# Patient Record
Sex: Female | Born: 1975
Health system: Southern US, Community
[De-identification: ages and names within clinical notes are randomized; demographics above are authoritative.]

## PROBLEM LIST (undated history)

## (undated) ENCOUNTER — Emergency Department (HOSPITAL_COMMUNITY): Admission: EM | Payer: Self-pay | Source: Home / Self Care

## (undated) DIAGNOSIS — Q249 Congenital malformation of heart, unspecified: Secondary | ICD-10-CM

## (undated) DIAGNOSIS — B019 Varicella without complication: Secondary | ICD-10-CM

## (undated) DIAGNOSIS — T7840XA Allergy, unspecified, initial encounter: Secondary | ICD-10-CM

## (undated) DIAGNOSIS — I519 Heart disease, unspecified: Secondary | ICD-10-CM

## (undated) DIAGNOSIS — K219 Gastro-esophageal reflux disease without esophagitis: Secondary | ICD-10-CM

## (undated) HISTORY — DX: Heart disease, unspecified: I51.9

## (undated) HISTORY — DX: Allergy, unspecified, initial encounter: T78.40XA

## (undated) HISTORY — DX: Varicella without complication: B01.9

## (undated) HISTORY — DX: Congenital malformation of heart, unspecified: Q24.9

## (undated) HISTORY — DX: Gastro-esophageal reflux disease without esophagitis: K21.9

---

## 1995-05-13 HISTORY — PX: CARDIAC DEFIBRILLATOR PLACEMENT: SHX171

## 2007-04-01 ENCOUNTER — Encounter: Admission: RE | Admit: 2007-04-01 | Discharge: 2007-04-01 | Payer: Self-pay | Admitting: Obstetrics

## 2010-10-09 ENCOUNTER — Emergency Department (HOSPITAL_COMMUNITY)
Admission: EM | Admit: 2010-10-09 | Discharge: 2010-10-09 | Disposition: A | Discharge status: Home / Self Care | Payer: PRIVATE HEALTH INSURANCE | Attending: Emergency Medicine | Admitting: Emergency Medicine

## 2010-10-09 DIAGNOSIS — K219 Gastro-esophageal reflux disease without esophagitis: Secondary | ICD-10-CM | POA: Insufficient documentation

## 2010-10-09 DIAGNOSIS — Z95810 Presence of automatic (implantable) cardiac defibrillator: Secondary | ICD-10-CM | POA: Insufficient documentation

## 2010-10-09 DIAGNOSIS — K3189 Other diseases of stomach and duodenum: Secondary | ICD-10-CM | POA: Insufficient documentation

## 2010-10-09 DIAGNOSIS — I499 Cardiac arrhythmia, unspecified: Secondary | ICD-10-CM | POA: Insufficient documentation

## 2010-10-09 LAB — CBC
HCT: 38.2 % (ref 36.0–46.0)
MCH: 31.5 pg (ref 26.0–34.0)
Platelets: 209 10*3/uL (ref 150–400)
RDW: 11.9 % (ref 11.5–15.5)
WBC: 8.6 10*3/uL (ref 4.0–10.5)

## 2010-10-09 LAB — COMPREHENSIVE METABOLIC PANEL
ALT: 11 U/L (ref 0–35)
Albumin: 3.7 g/dL (ref 3.5–5.2)
Alkaline Phosphatase: 71 U/L (ref 39–117)
CO2: 29 mEq/L (ref 19–32)
Creatinine, Ser: 0.79 mg/dL (ref 0.4–1.2)
Potassium: 4 mEq/L (ref 3.5–5.1)
Sodium: 139 mEq/L (ref 135–145)

## 2010-10-09 LAB — DIFFERENTIAL
Eosinophils Absolute: 0.1 10*3/uL (ref 0.0–0.7)
Eosinophils Relative: 1 % (ref 0–5)
Lymphocytes Relative: 20 % (ref 12–46)
Lymphs Abs: 1.7 10*3/uL (ref 0.7–4.0)
Monocytes Absolute: 0.5 10*3/uL (ref 0.1–1.0)
Monocytes Relative: 6 % (ref 3–12)

## 2010-10-09 LAB — LIPASE, BLOOD: Lipase: 19 U/L (ref 11–59)

## 2010-11-20 ENCOUNTER — Other Ambulatory Visit (HOSPITAL_COMMUNITY): Payer: Self-pay | Admitting: Gastroenterology

## 2010-11-20 DIAGNOSIS — R110 Nausea: Secondary | ICD-10-CM

## 2010-12-05 ENCOUNTER — Encounter (HOSPITAL_COMMUNITY)
Admission: RE | Admit: 2010-12-05 | Discharge: 2010-12-05 | Disposition: A | Discharge status: Home / Self Care | Payer: PRIVATE HEALTH INSURANCE | Source: Ambulatory Visit | Attending: Gastroenterology | Admitting: Gastroenterology

## 2010-12-05 DIAGNOSIS — R110 Nausea: Secondary | ICD-10-CM | POA: Insufficient documentation

## 2010-12-05 MED ORDER — TECHNETIUM TC 99M SULFUR COLLOID
2.0000 | Freq: Once | INTRAVENOUS | Status: AC | PRN
  Administered 2010-12-05: 2 via ORAL

## 2011-02-07 DIAGNOSIS — I428 Other cardiomyopathies: Secondary | ICD-10-CM | POA: Insufficient documentation

## 2011-03-25 DIAGNOSIS — Z95810 Presence of automatic (implantable) cardiac defibrillator: Secondary | ICD-10-CM | POA: Insufficient documentation

## 2011-12-05 ENCOUNTER — Ambulatory Visit (INDEPENDENT_AMBULATORY_CARE_PROVIDER_SITE_OTHER): Payer: PRIVATE HEALTH INSURANCE | Admitting: Family Medicine

## 2011-12-05 ENCOUNTER — Encounter: Payer: Self-pay | Admitting: Family Medicine

## 2011-12-05 VITALS — BP 108/70 | HR 81 | Temp 98.3°F | Ht 65.75 in | Wt 131.6 lb

## 2011-12-05 DIAGNOSIS — K3184 Gastroparesis: Secondary | ICD-10-CM | POA: Insufficient documentation

## 2011-12-05 DIAGNOSIS — H02719 Chloasma of unspecified eye, unspecified eyelid and periocular area: Secondary | ICD-10-CM

## 2011-12-05 DIAGNOSIS — Q249 Congenital malformation of heart, unspecified: Secondary | ICD-10-CM | POA: Insufficient documentation

## 2011-12-05 NOTE — Patient Instructions (Addendum)
Schedule your complete physical yearly at your convenience Think of [place] as your home base! Call with any questions or concerns Welcome!  We're glad to have you!!
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
## 2011-12-16 DIAGNOSIS — H02719 Chloasma of unspecified eye, unspecified eyelid and periocular area: Secondary | ICD-10-CM | POA: Insufficient documentation

## 2011-12-16 NOTE — Assessment & Plan Note (Signed)
New to provider.  Following w/ GI.  No known cause.  Previously on Reglan but now only taking this PRN.  Pt's sxs seem to have component of IBS.  Will follow along and assist as able.
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
## 2011-12-16 NOTE — Assessment & Plan Note (Signed)
New to provider.  Following regularly w/ [facility].  Has ICD in place.  Currently asymptomatic.  Will follow.

## 2012-07-14 ENCOUNTER — Ambulatory Visit (INDEPENDENT_AMBULATORY_CARE_PROVIDER_SITE_OTHER): Payer: PRIVATE HEALTH INSURANCE | Admitting: Family Medicine

## 2012-07-14 ENCOUNTER — Encounter: Payer: Self-pay | Admitting: Family Medicine

## 2012-07-14 VITALS — BP 94/60 | HR 67 | Temp 98.3°F | Ht 65.75 in | Wt 137.2 lb

## 2012-07-14 DIAGNOSIS — R42 Dizziness and giddiness: Secondary | ICD-10-CM

## 2012-07-14 DIAGNOSIS — J309 Allergic rhinitis, unspecified: Secondary | ICD-10-CM

## 2012-07-14 LAB — CBC WITH DIFFERENTIAL/PLATELET
Basophils Relative: 1.1 % (ref 0.0–3.0)
HCT: 36.8 % (ref 36.0–46.0)
Hemoglobin: 12.5 g/dL (ref 12.0–15.0)
Lymphs Abs: 1 10*3/uL (ref 0.7–4.0)
MCHC: 33.9 g/dL (ref 30.0–36.0)
Monocytes Absolute: 0.4 10*3/uL (ref 0.1–1.0)
Neutro Abs: 2.7 10*3/uL (ref 1.4–7.7)
Neutrophils Relative %: 62.3 % (ref 43.0–77.0)
Platelets: 202 10*3/uL (ref 150.0–400.0)
RBC: 3.97 Mil/uL (ref 3.87–5.11)
RDW: 12.7 % (ref 11.5–14.6)
WBC: 4.4 10*3/uL — ABNORMAL LOW (ref 4.5–10.5)

## 2012-07-14 LAB — BASIC METABOLIC PANEL
BUN: 13 mg/dL (ref 6–23)
Potassium: 3.8 mEq/L (ref 3.5–5.1)

## 2012-07-14 MED ORDER — FLUTICASONE PROPIONATE 50 MCG/ACT NA SUSP: 2.0000 | Freq: Every day | NASAL | Status: DC

## 2012-07-14 NOTE — Progress Notes (Signed)
  Subjective:    Patient ID: Lindsay Hanson, female    DOB: Dec 20, 1975, 37 y.o.   MRN: [id number]  HPI Dizzy spells- sxs started ~1 month ago.  Denies vertigo.  More of an 'off balance' feeling.  Worse w/ bending over and standing quickly.  Also occurs w/ walking.  No dizziness w/ turning of head.  Following w/ Dr Alden Hipp at [facility] for ARVD.  Nasal congestion- sxs started yesterday, no fevers.  + facial pressure, ear pressure/pain.  No cough.  + sneezing.  + seasonal allergies.  Not currently on meds.   Review of Systems For ROS see HPI     Objective:   Physical Exam  Vitals reviewed. Constitutional: She is oriented to person, place, and time. She appears well-developed and well-nourished. No distress.  HENT:  Head: Normocephalic and atraumatic.  Right Ear: Tympanic membrane normal.  Left Ear: Tympanic membrane normal.  Nose: Mucosal edema and rhinorrhea present. Right sinus exhibits no maxillary sinus tenderness and no frontal sinus tenderness. Left sinus exhibits no maxillary sinus tenderness and no frontal sinus tenderness.  Mouth/Throat: Mucous membranes are normal. Posterior oropharyngeal erythema (w/ PND) present.  Eyes: Conjunctivae and EOM are normal. Pupils are equal, round, and reactive to light.  Neck: Normal range of motion. Neck supple.  Cardiovascular: Normal rate, regular rhythm and normal heart sounds.   Pulmonary/Chest: Effort normal and breath sounds normal. No respiratory distress. She has no wheezes. She has no rales.  Lymphadenopathy:    She has no cervical adenopathy.  Neurological: She is alert and oriented to person, place, and time. She has normal reflexes. No cranial nerve deficit. Coordination normal.  Skin: Skin is warm and dry.          Assessment & Plan:

## 2012-07-14 NOTE — Patient Instructions (Addendum)
Your nasal congestion is due to seasonal allergies Start the Flonase daily- 2 sprays each nostril Start OTC Claritin or Zyrtec daily Your dizziness appears to be due to orthostasis- drop in BP that can't respond due to the meds Change positions slowly Increase your fluid intake We'll notify you of your lab results and make any changes if needed Hang in there!

## 2012-07-15 ENCOUNTER — Encounter: Payer: Self-pay | Admitting: *Deleted

## 2012-07-17 NOTE — Assessment & Plan Note (Addendum)
New.  Pt's sxs and PE consistent w/ untreated allergies.  Start nasal steroid, OTC antihistamine.  Will follow.

## 2012-07-17 NOTE — Assessment & Plan Note (Signed)
New.  Suspect that pt is orthostatic due to beta blocker use.  Will check labs to r/o anemia, electrolyte abnormality, thyroid problem.  Encouraged increased fluids.  Will forward note to [facility] for possible medication adjustment.  Pt expressed understanding and is in agreement w/ plan.

## 2012-10-21 ENCOUNTER — Other Ambulatory Visit (INDEPENDENT_AMBULATORY_CARE_PROVIDER_SITE_OTHER): Payer: PRIVATE HEALTH INSURANCE

## 2012-10-21 DIAGNOSIS — Z79899 Other long term (current) drug therapy: Secondary | ICD-10-CM

## 2012-10-21 LAB — MAGNESIUM: Magnesium: 1.7 mg/dL (ref 1.5–2.5)

## 2012-10-21 LAB — BRAIN NATRIURETIC PEPTIDE: Pro B Natriuretic peptide (BNP): 164 pg/mL — ABNORMAL HIGH (ref 0.0–100.0)

## 2013-04-11 LAB — HM PAP SMEAR: HM Pap smear: NORMAL

## 2013-06-07 ENCOUNTER — Encounter (HOSPITAL_COMMUNITY): Payer: PRIVATE HEALTH INSURANCE

## 2013-06-10 ENCOUNTER — Ambulatory Visit (HOSPITAL_COMMUNITY)
Admission: RE | Admit: 2013-06-10 | Discharge: 2013-06-10 | Disposition: A | Discharge status: Home / Self Care | Payer: PRIVATE HEALTH INSURANCE | Source: Ambulatory Visit | Attending: Obstetrics | Admitting: Obstetrics

## 2013-07-19 ENCOUNTER — Encounter: Payer: Self-pay | Admitting: General Practice

## 2013-10-25 ENCOUNTER — Encounter: Payer: PRIVATE HEALTH INSURANCE | Admitting: Family Medicine

## 2013-10-25 ENCOUNTER — Telehealth: Payer: Self-pay

## 2013-10-25 NOTE — Telephone Encounter (Signed)
Left message for call back Non identifiable  

## 2013-10-26 ENCOUNTER — Ambulatory Visit (INDEPENDENT_AMBULATORY_CARE_PROVIDER_SITE_OTHER): Payer: PRIVATE HEALTH INSURANCE | Admitting: Family Medicine

## 2013-10-26 ENCOUNTER — Encounter: Payer: Self-pay | Admitting: Family Medicine

## 2013-10-26 VITALS — BP 112/72 | HR 74 | Temp 98.6°F | Resp 16 | Ht 66.5 in | Wt 134.2 lb

## 2013-10-26 DIAGNOSIS — Z0000 Encounter for general adult medical examination without abnormal findings: Secondary | ICD-10-CM

## 2013-10-26 DIAGNOSIS — Q249 Congenital malformation of heart, unspecified: Secondary | ICD-10-CM

## 2013-10-26 NOTE — Progress Notes (Signed)
Pre visit review using our clinic review tool, if applicable. No additional management support is needed unless otherwise documented below in the visit note. 

## 2013-10-26 NOTE — Assessment & Plan Note (Signed)
Pt's PE WNL.  UTD on GYN.  Check labs.  EKG unchanged from previous.  Will forward to [facility] (Dr Alden Hipp)

## 2013-10-26 NOTE — Patient Instructions (Signed)
Follow up in 1 year for your physical- sooner if needed We'll notify you of your lab results and make any changes if needed Keep up the good work!  You look great! Call with any questions or concerns Hang in there!  Hopefully the tough stuff is behind you!!!

## 2013-10-26 NOTE — Progress Notes (Signed)
   Subjective:    Patient ID: Lindsay Hanson, female    DOB: 1976-03-10, 38 y.o.   MRN: [id number]  HPI CPE- UTD on GYN ([facility]).  No concerns today.   Review of Systems Patient reports no vision/ hearing changes, adenopathy,fever, weight change,  persistant/recurrent hoarseness , swallowing issues, chest pain, palpitations, edema, persistant/recurrent cough, hemoptysis, dyspnea (rest/exertional/paroxysmal nocturnal), gastrointestinal bleeding (melena, rectal bleeding), abdominal pain, significant heartburn, bowel changes, GU symptoms (dysuria, hematuria, incontinence), Gyn symptoms (abnormal  bleeding, pain),  syncope, focal weakness, memory loss, numbness & tingling, skin/hair/nail changes, abnormal bruising or bleeding, anxiety, or depression.     Objective:   Physical Exam General Appearance:    Alert, cooperative, no distress, appears stated age  Head:    Normocephalic, without obvious abnormality, atraumatic  Eyes:    PERRL, conjunctiva/corneas clear, EOM's intact, fundi    benign, both eyes  Ears:    Normal TM's and external ear canals, both ears  Nose:   Nares normal, septum midline, mucosa normal, no drainage    or sinus tenderness  Throat:   Lips, mucosa, and tongue normal; teeth and gums normal  Neck:   Supple, symmetrical, trachea midline, no adenopathy;    Thyroid: no enlargement/tenderness/nodules  Back:     Symmetric, no curvature, ROM normal, no CVA tenderness  Lungs:     Clear to auscultation bilaterally, respirations unlabored  Chest Wall:    No tenderness or deformity   Heart:    Regular rate and rhythm, S1 and S2 normal, no murmur, rub   or gallop  Breast Exam:    Deferred to GYN  Abdomen:     Soft, non-tender, bowel sounds active all four quadrants,    no masses, no organomegaly  Genitalia:    Deferred to GYN  Rectal:    Extremities:   Extremities normal, atraumatic, no cyanosis or edema  Pulses:   2+ and symmetric all extremities  Skin:   Skin color,  texture, turgor normal, no rashes or lesions  Lymph nodes:   Cervical, supraclavicular, and axillary nodes normal  Neurologic:   CNII-XII intact, normal strength, sensation and reflexes    throughout          Assessment & Plan:

## 2013-10-27 LAB — VITAMIN D 25 HYDROXY (VIT D DEFICIENCY, FRACTURES): VITD: 30.45 ng/mL

## 2013-10-27 LAB — CBC WITH DIFFERENTIAL/PLATELET
BASOS PCT: 0.3 % (ref 0.0–3.0)
Basophils Absolute: 0 10*3/uL (ref 0.0–0.1)
Eosinophils Absolute: 0.2 10*3/uL (ref 0.0–0.7)
Eosinophils Relative: 3.7 % (ref 0.0–5.0)
HCT: 37.5 % (ref 36.0–46.0)
HEMOGLOBIN: 12.5 g/dL (ref 12.0–15.0)
LYMPHS ABS: 1.7 10*3/uL (ref 0.7–4.0)
Lymphocytes Relative: 30.4 % (ref 12.0–46.0)
MCHC: 33.4 g/dL (ref 30.0–36.0)
MCV: 95.1 fl (ref 78.0–100.0)
MONO ABS: 0.2 10*3/uL (ref 0.1–1.0)
Monocytes Relative: 4.1 % (ref 3.0–12.0)
Neutro Abs: 3.5 10*3/uL (ref 1.4–7.7)
Neutrophils Relative %: 61.5 % (ref 43.0–77.0)
Platelets: 208 10*3/uL (ref 150.0–400.0)
RBC: 3.94 Mil/uL (ref 3.87–5.11)
RDW: 12.6 % (ref 11.5–15.5)
WBC: 5.7 10*3/uL (ref 4.0–10.5)

## 2013-10-27 LAB — LIPID PANEL
CHOL/HDL RATIO: 3
Cholesterol: 162 mg/dL (ref 0–200)
HDL: 58.6 mg/dL (ref >39.00)
LDL CALC: 91 mg/dL (ref 0–99)
NonHDL: 103.4
Triglycerides: 62 mg/dL (ref 0.0–149.0)
VLDL: 12.4 mg/dL (ref 0.0–40.0)

## 2013-10-27 LAB — HEPATIC FUNCTION PANEL
ALBUMIN: 4.3 g/dL (ref 3.5–5.2)
ALT: 23 U/L (ref 0–35)
AST: 20 U/L (ref 0–37)
Alkaline Phosphatase: 58 U/L (ref 39–117)
BILIRUBIN DIRECT: 0.1 mg/dL (ref 0.0–0.3)
BILIRUBIN TOTAL: 0.4 mg/dL (ref 0.2–1.2)
TOTAL PROTEIN: 7.3 g/dL (ref 6.0–8.3)

## 2013-10-27 LAB — BASIC METABOLIC PANEL
BUN: 14 mg/dL (ref 6–23)
CALCIUM: 9.2 mg/dL (ref 8.4–10.5)
CHLORIDE: 106 meq/L (ref 96–112)
CO2: 28 mEq/L (ref 19–32)
CREATININE: 0.8 mg/dL (ref 0.4–1.2)
GFR: 104.47 mL/min (ref >60.00)
Glucose, Bld: 85 mg/dL (ref 70–99)
POTASSIUM: 4.2 meq/L (ref 3.5–5.1)
Sodium: 139 mEq/L (ref 135–145)

## 2013-10-27 LAB — TSH: TSH: 0.38 u[IU]/mL (ref 0.35–4.50)

## 2013-11-03 ENCOUNTER — Telehealth: Payer: Self-pay | Admitting: Family Medicine

## 2013-11-03 NOTE — Telephone Encounter (Signed)
Patient Information:  Caller Name: Lindsay Hanson  Phone: [phone]  Patient: Lindsay Hanson, Lindsay Hanson  Gender: Female  DOB: 1976-02-21  Age: 38 Years  PCP: Sheliah Hatchabori, Katherine E.  Pregnant: No  Office Follow Up:  Does the office need to follow up with this patient?: Yes  Instructions For The Office: Call regarding work in appointment  RN Note:  No appointments remain at [place], [facility] or [facility] offices.  Message sent to office staff to call patient regarding work in appointment.  She is willing to be seen 11/04/13.    Symptoms  Reason For Call & Symptoms: Emergent call:  Itchy arms with hives 10/30/13 then hives and itching on legs 10/31/13.  Related symptms to use of two sunscreens.  Reviewed Health History In EMR: Yes  Reviewed Medications In EMR: Yes  Reviewed Allergies In EMR: Yes  Reviewed Surgeries / Procedures: Yes  Date of Onset of Symptoms: 10/30/2013  Treatments Tried: Benadryl 50 mg every 6 hours, Cortisone cream  Treatments Tried Worked: Yes OB / GYN:  LMP: 10/25/2013  Guideline(s) Used:  Itching - Widespread  Hives  Disposition Per Guideline:   See Today in Office  Reason For Disposition Reached:   Moderate-Severe hives persist (i.e., hives interfere with normal activities or work) and taking antihistamine (e.g., Benadryl, Claritin) > 24 hours  Advice Given:  Antihistamine  One or two dosages of an antihistamine will accelerate the clearing of this type of hives.  Benadryl (diphenhydramine) is an antihistamine. The adult dose is 25-50 mg. If the hives are still present after 6 hours, repeat the Benadryl.  If Benadryl is not available, use any hay fever or cold medicine that contains an antihistamine. Examples of other antihistamines are chlorpheniramine (Chlortrimeton, Chlor-tripolon) and loratadine (Claritin, Alavert). Loratadine is a newer (second generation) antihistamine and it causes less sedation than diphenhydramine.  CAUTION: This type of medication may cause  sleepiness. Do not drink alcohol, drive, or operate dangerous machinery while taking antihistamines. Do not take these medications if you have prostate problems.  Widespread Hives:  Remove allergens. For widespread hives be certain to take a bath or shower, if triggered by pollens or animal contact. Change clothes.  Take a cool bath for 10 minutes to relieve itching. Rub very itchy areas with an ice cube for 10 minutes.  Hives normally come and go for 3 or 4 days, then disappear.  Widespread Hives:  Remove allergens. For widespread hives be certain to take a bath or shower, if triggered by pollens or animal contact. Change clothes.  Take a cool bath for 10 minutes to relieve itching. Rub very itchy areas with an ice cube for 10 minutes.  Hives normally come and go for 3 or 4 days, then disappear.  Antihistamine  Take an antihistamine like loratadine (e.g., OTC Claritin, Alavert) for widespread hives that itch. The adult dosage of loratadine is 10 mg by mouth once each day. Continue the antihistamine until the hives have been gone for 24 hours.  Loratidine is a newer (second-generation) antihistamine and it causes less sedation than diphenhydramine (Benadryl) or chlorpheniramine (Chlor-Trimeton).  CAUTION: This type of medication may cause sleepiness. Do not drink alcohol, drive, or operate dangerous machinery while taking antihistamines. Do not take these medications if you have prostate problems.  Prevention:  If you identify a substance that causes hives, try to avoid that substance in the future.  Call Back If:  Severe itching lasts longer than 24 hours while taking an antihistamine  Hives persist longer than  1 week  You become worse.  Patient Will Follow Care Advice:  YES

## 2013-11-04 ENCOUNTER — Encounter: Payer: Self-pay | Admitting: Family Medicine

## 2013-11-04 ENCOUNTER — Ambulatory Visit (INDEPENDENT_AMBULATORY_CARE_PROVIDER_SITE_OTHER): Payer: PRIVATE HEALTH INSURANCE | Admitting: Family Medicine

## 2013-11-04 VITALS — BP 102/80 | HR 61 | Temp 98.4°F | Resp 16 | Wt 136.5 lb

## 2013-11-04 DIAGNOSIS — L259 Unspecified contact dermatitis, unspecified cause: Secondary | ICD-10-CM | POA: Insufficient documentation

## 2013-11-04 MED ORDER — TRIAMCINOLONE ACETONIDE 0.1 % EX OINT: 1.0000 "application " | TOPICAL_OINTMENT | Freq: Two times a day (BID) | CUTANEOUS | Status: DC

## 2013-11-04 NOTE — Telephone Encounter (Signed)
Unable to reach patient pre visit.  

## 2013-11-04 NOTE — Progress Notes (Signed)
Pre visit review using our clinic review tool, if applicable. No additional management support is needed unless otherwise documented below in the visit note. 

## 2013-11-04 NOTE — Telephone Encounter (Signed)
Appointment scheduled for 11/04/13 @ 2:45 pm with Dr. Beverely Low.  Appointment was scheduled by Vidal Schwalbe, RN.
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
## 2013-11-04 NOTE — Assessment & Plan Note (Signed)
New.  Suspect this is due to aerosol sunscreen.  Already improving.  Triamcinolone prn.  Reviewed supportive care and red flags that should prompt return.  Pt expressed understanding and is in agreement w/ plan.

## 2013-11-04 NOTE — Patient Instructions (Signed)
Follow up as needed Use the triamcinolone ointment in place of the hydrocortisone cream for itching Continue the benadryl as needed- particularly at night Call with any questions or concerns Have a great 4th of July!!!

## 2014-02-22 ENCOUNTER — Ambulatory Visit (INDEPENDENT_AMBULATORY_CARE_PROVIDER_SITE_OTHER): Payer: PRIVATE HEALTH INSURANCE | Admitting: Family Medicine

## 2014-02-22 ENCOUNTER — Encounter: Payer: Self-pay | Admitting: Family Medicine

## 2014-02-22 ENCOUNTER — Ambulatory Visit (HOSPITAL_BASED_OUTPATIENT_CLINIC_OR_DEPARTMENT_OTHER)
Admission: RE | Admit: 2014-02-22 | Discharge: 2014-02-22 | Disposition: A | Discharge status: Home / Self Care | Payer: PRIVATE HEALTH INSURANCE | Source: Ambulatory Visit | Attending: Family Medicine | Admitting: Family Medicine

## 2014-02-22 VITALS — BP 102/74 | HR 64 | Temp 98.3°F | Resp 16 | Wt 134.4 lb

## 2014-02-22 DIAGNOSIS — R140 Abdominal distension (gaseous): Secondary | ICD-10-CM | POA: Insufficient documentation

## 2014-02-22 DIAGNOSIS — R109 Unspecified abdominal pain: Secondary | ICD-10-CM | POA: Diagnosis not present

## 2014-02-22 DIAGNOSIS — K3184 Gastroparesis: Secondary | ICD-10-CM

## 2014-02-22 DIAGNOSIS — K219 Gastro-esophageal reflux disease without esophagitis: Secondary | ICD-10-CM

## 2014-02-22 DIAGNOSIS — R1084 Generalized abdominal pain: Secondary | ICD-10-CM

## 2014-02-22 LAB — POCT URINALYSIS DIPSTICK
BILIRUBIN UA: NEGATIVE
Blood, UA: NEGATIVE
GLUCOSE UA: NEGATIVE
KETONES UA: NEGATIVE
Leukocytes, UA: NEGATIVE
NITRITE UA: NEGATIVE
PH UA: 6
PROTEIN UA: NEGATIVE
Spec Grav, UA: 1.015
UROBILINOGEN UA: 0.2

## 2014-02-22 LAB — CBC WITH DIFFERENTIAL/PLATELET
BASOS ABS: 0 10*3/uL (ref 0.0–0.1)
BASOS PCT: 0.4 % (ref 0.0–3.0)
EOS PCT: 2.2 % (ref 0.0–5.0)
Eosinophils Absolute: 0.2 10*3/uL (ref 0.0–0.7)
HEMATOCRIT: 40.7 % (ref 36.0–46.0)
HEMOGLOBIN: 13.4 g/dL (ref 12.0–15.0)
LYMPHS ABS: 2.2 10*3/uL (ref 0.7–4.0)
LYMPHS PCT: 29 % (ref 12.0–46.0)
MCHC: 32.8 g/dL (ref 30.0–36.0)
MCV: 93.8 fl (ref 78.0–100.0)
Monocytes Absolute: 0.3 10*3/uL (ref 0.1–1.0)
Monocytes Relative: 3.4 % (ref 3.0–12.0)
NEUTROS ABS: 4.8 10*3/uL (ref 1.4–7.7)
Neutrophils Relative %: 65 % (ref 43.0–77.0)
PLATELETS: 226 10*3/uL (ref 150.0–400.0)
RBC: 4.34 Mil/uL (ref 3.87–5.11)
RDW: 12.5 % (ref 11.5–15.5)
WBC: 7.4 10*3/uL (ref 4.0–10.5)

## 2014-02-22 LAB — BASIC METABOLIC PANEL
BUN: 16 mg/dL (ref 6–23)
CHLORIDE: 100 meq/L (ref 96–112)
CO2: 25 meq/L (ref 19–32)
CREATININE: 0.8 mg/dL (ref 0.4–1.2)
Calcium: 8.9 mg/dL (ref 8.4–10.5)
GFR: 110.74 mL/min (ref >60.00)
GLUCOSE: 73 mg/dL (ref 70–99)
Potassium: 3.6 mEq/L (ref 3.5–5.1)
Sodium: 135 mEq/L (ref 135–145)

## 2014-02-22 LAB — HEPATIC FUNCTION PANEL
ALK PHOS: 63 U/L (ref 39–117)
ALT: 15 U/L (ref 0–35)
AST: 16 U/L (ref 0–37)
Albumin: 3.7 g/dL (ref 3.5–5.2)
BILIRUBIN DIRECT: 0.1 mg/dL (ref 0.0–0.3)
BILIRUBIN TOTAL: 0.7 mg/dL (ref 0.2–1.2)
Total Protein: 7.9 g/dL (ref 6.0–8.3)

## 2014-02-22 LAB — H. PYLORI ANTIBODY, IGG: H Pylori IgG: POSITIVE — AB

## 2014-02-22 MED ORDER — GI COCKTAIL ~~LOC~~
30.0000 mL | Freq: Once | ORAL | Status: AC
  Administered 2014-02-22: 30 mL via ORAL

## 2014-02-22 MED ORDER — PANTOPRAZOLE SODIUM 40 MG PO TBEC: 40.0000 mg | DELAYED_RELEASE_TABLET | Freq: Every day | ORAL | Status: DC

## 2014-02-22 NOTE — Progress Notes (Signed)
   Subjective:    Patient ID: Lindsay Hanson, female    DOB: 1975/12/16, 38 y.o.   MRN: [id number]  HPI abd bloating and distension- sxs started ~1 week ago.  Pt is having irregular BMs despite using Miralax.  Hx of constipation and gastroparesis.  Having burning radiating up into chest, 'like it's suffocating'.  Today sxs are somewhat improved.  '2 days ago was terrible'.  No vomiting, minimal nausea.  Has seen [facility] previously.  Pt took dose pack last week for back pain.   Review of Systems For ROS see HPI     Objective:   Physical Exam  Vitals reviewed. Constitutional: She is oriented to person, place, and time. She appears well-developed and well-nourished. No distress.  HENT:  Head: Normocephalic and atraumatic.  Eyes: Conjunctivae and EOM are normal. Pupils are equal, round, and reactive to light.  Neck: Normal range of motion. Neck supple. No thyromegaly present.  Cardiovascular: Normal rate, regular rhythm, normal heart sounds and intact distal pulses.   No murmur heard. Pulmonary/Chest: Effort normal and breath sounds normal. No respiratory distress.  Abdominal: Soft. Bowel sounds are normal. She exhibits no distension. There is no tenderness. There is no rebound and no guarding.  Musculoskeletal: She exhibits no edema.  Lymphadenopathy:    She has no cervical adenopathy.  Neurological: She is alert and oriented to person, place, and time.  Skin: Skin is warm and dry.  Psychiatric: She has a normal mood and affect. Her behavior is normal.          Assessment & Plan:

## 2014-02-22 NOTE — Progress Notes (Signed)
Pre visit review using our clinic review tool, if applicable. No additional management support is needed unless otherwise documented below in the visit note. 

## 2014-02-22 NOTE — Patient Instructions (Signed)
Go downstairs and get your xray before leaving We'll notify you of your lab results and make any changes if needed Start the Protonix daily to decrease acid production Continue the Miralax daily to improve constipation Call with any questions or concerns- particularly if pain is worsening or not improving Hang in there!!!

## 2014-02-23 ENCOUNTER — Telehealth: Payer: Self-pay | Admitting: Family Medicine

## 2014-02-23 ENCOUNTER — Other Ambulatory Visit: Payer: Self-pay | Admitting: General Practice

## 2014-02-23 MED ORDER — METRONIDAZOLE 500 MG PO TABS: 500.0000 mg | ORAL_TABLET | Freq: Three times a day (TID) | ORAL | Status: DC

## 2014-02-23 MED ORDER — DOXYCYCLINE HYCLATE 100 MG PO TABS: 100.0000 mg | ORAL_TABLET | Freq: Two times a day (BID) | ORAL | Status: DC

## 2014-02-23 MED ORDER — BISMUTH SUBSALICYLATE 262 MG PO TABS: 2.0000 | ORAL_TABLET | Freq: Three times a day (TID) | ORAL | Status: DC

## 2014-02-23 NOTE — Telephone Encounter (Signed)
Pt notified of results

## 2014-02-23 NOTE — Telephone Encounter (Signed)
Caller name: Mateja  Call back [phone]   Reason for call:  Wanting imaging results

## 2014-02-24 ENCOUNTER — Other Ambulatory Visit: Payer: Self-pay | Admitting: General Practice

## 2014-02-24 ENCOUNTER — Encounter: Payer: Self-pay | Admitting: Family Medicine

## 2014-02-24 MED ORDER — BISMUTH SUBSALICYLATE 262 MG PO TABS: 2.0000 | ORAL_TABLET | Freq: Three times a day (TID) | ORAL | Status: DC

## 2014-02-26 NOTE — Assessment & Plan Note (Signed)
New.  Suspect this is due to gastritis from dose pack.  Check labs to r/o H pylori, infection, or biliary abnormalities.  Start PPI.  Reviewed supportive care and red flags that should prompt return.  Pt expressed understanding and is in agreement w/ plan.

## 2014-02-26 NOTE — Assessment & Plan Note (Signed)
Pt has hx of similar.  Unclear if current sxs are exacerbation of this but pt has been asymptomatic for quite awhile.  Will follow.

## 2014-02-26 NOTE — Assessment & Plan Note (Signed)
New.  Suspect pt w/ gastritis due to dose pack.  Start PPI.  Check labs to r/o underlying cause- H pylori- and tx prn.  Reviewed supportive care and red flags that should prompt return.  Pt expressed understanding and is in agreement w/ plan.

## 2014-02-27 ENCOUNTER — Encounter: Payer: Self-pay | Admitting: Family Medicine

## 2014-02-27 NOTE — Telephone Encounter (Signed)
Caller name: Marjean  Call back number: [phone]   Reason for call:  Pt is experiencing nausea from medications, and she doesn't know which one may do that, or if it's a combo of many.  Call back to advise.     doxycycline (VIBRA-TABS) 100 MG tablet  Bismuth Subsalicylate 262 MG TABS  metroNIDAZOLE (FLAGYL) 500 MG tablet  pantoprazole (PROTONIX) 40 MG tablet

## 2014-02-28 ENCOUNTER — Other Ambulatory Visit: Payer: Self-pay | Admitting: Family Medicine

## 2014-02-28 MED ORDER — ONDANSETRON HCL 4 MG PO TABS: 4.0000 mg | ORAL_TABLET | Freq: Three times a day (TID) | ORAL | Status: DC | PRN

## 2014-03-06 ENCOUNTER — Encounter: Payer: Self-pay | Admitting: Family Medicine

## 2014-03-08 ENCOUNTER — Telehealth: Payer: Self-pay | Admitting: Family Medicine

## 2014-03-08 DIAGNOSIS — R110 Nausea: Secondary | ICD-10-CM

## 2014-03-08 DIAGNOSIS — B9681 Helicobacter pylori [H. pylori] as the cause of diseases classified elsewhere: Secondary | ICD-10-CM

## 2014-03-08 DIAGNOSIS — K2970 Gastritis, unspecified, without bleeding: Secondary | ICD-10-CM

## 2014-03-08 NOTE — Telephone Encounter (Signed)
Caller name: Vivyanna, Masri Relation to pt: SELF  Call back number: [phone]   Reason for call:   In need of clinical advice pt is still vomiting and would like a dr note excusing her for work for a few days. Please advise

## 2014-03-08 NOTE — Telephone Encounter (Signed)
Referral placed.  Note printed and given to Dr. Beverely Low for review and signature.  Pt aware and stated that she would send someone to pick up letter.

## 2014-03-08 NOTE — Telephone Encounter (Signed)
Pt has seen Dr Dulce Sellar previously at [facility].  She can call and schedule or we can enter a referral. OK to print work note excusing pt due to ongoing nausea.  Estimated return to work date 11/2 or until cleared by GI

## 2014-03-08 NOTE — Telephone Encounter (Signed)
States she continues to have "severe nausea," lack of appetite, and diarrhea.  Nausea worsen with motions.  Denies other symptoms.  Pt would like suggestion for GI specialist and a work note excusing her from work.    Please advise.

## 2014-04-04 ENCOUNTER — Ambulatory Visit (HOSPITAL_COMMUNITY): Admission: RE | Admit: 2014-04-04 | Payer: PRIVATE HEALTH INSURANCE | Source: Ambulatory Visit

## 2014-10-11 ENCOUNTER — Telehealth: Payer: Self-pay | Admitting: Family Medicine

## 2014-10-11 NOTE — Telephone Encounter (Signed)
Pre Visit letter sent  °

## 2014-11-01 ENCOUNTER — Encounter: Payer: Self-pay | Admitting: Family Medicine

## 2014-11-01 ENCOUNTER — Ambulatory Visit (INDEPENDENT_AMBULATORY_CARE_PROVIDER_SITE_OTHER): Payer: PRIVATE HEALTH INSURANCE | Admitting: Family Medicine

## 2014-11-01 ENCOUNTER — Encounter: Payer: PRIVATE HEALTH INSURANCE | Admitting: Family Medicine

## 2014-11-01 VITALS — BP 112/72 | HR 56 | Temp 98.0°F | Resp 16 | Ht 66.5 in | Wt 140.4 lb

## 2014-11-01 DIAGNOSIS — I428 Other cardiomyopathies: Secondary | ICD-10-CM

## 2014-11-01 DIAGNOSIS — Z0000 Encounter for general adult medical examination without abnormal findings: Secondary | ICD-10-CM

## 2014-11-01 NOTE — Patient Instructions (Signed)
Follow up in 1 year or as needed Keep up the good work!  You look great! We'll notify you of your lab results and make any changes if needed Call with any questions or concerns ENJOY YOUR SUMMER!!! Have an EPIC 39th Birthday Party!!

## 2014-11-01 NOTE — Assessment & Plan Note (Signed)
Pt's PE WNL.  UTD on GYN ([facility]).  Check labs.  Anticipatory guidance provided.

## 2014-11-01 NOTE — Assessment & Plan Note (Signed)
Check EKG, BMP, Mg due to chronic Sotalol use and fax results to [facility].

## 2014-11-01 NOTE — Progress Notes (Signed)
   Subjective:    Patient ID: Lindsay Hanson, female    DOB: 08-24-1975, 39 y.o.   MRN: [id number]  HPI CPE- UTD on GYN ([facility]).  No concerns   Review of Systems Patient reports no vision/ hearing changes, adenopathy,fever, weight change,  persistant/recurrent hoarseness , swallowing issues, chest pain, palpitations, edema, persistant/recurrent cough, hemoptysis, dyspnea (rest/exertional/paroxysmal nocturnal), gastrointestinal bleeding (melena, rectal bleeding), abdominal pain, significant heartburn, bowel changes, GU symptoms (dysuria, hematuria, incontinence), Gyn symptoms (abnormal  bleeding, pain),  syncope, focal weakness, memory loss, numbness & tingling, skin/hair/nail changes, abnormal bruising or bleeding, anxiety, or depression.     Objective:   Physical Exam General Appearance:    Alert, cooperative, no distress, appears stated age  Head:    Normocephalic, without obvious abnormality, atraumatic  Eyes:    PERRL, conjunctiva/corneas clear, EOM's intact, fundi    benign, both eyes  Ears:    Normal TM's and external ear canals, both ears  Nose:   Nares normal, septum midline, mucosa normal, no drainage    or sinus tenderness  Throat:   Lips, mucosa, and tongue normal; teeth and gums normal  Neck:   Supple, symmetrical, trachea midline, no adenopathy;    Thyroid: no enlargement/tenderness/nodules  Back:     Symmetric, no curvature, ROM normal, no CVA tenderness  Lungs:     Clear to auscultation bilaterally, respirations unlabored  Chest Wall:    No tenderness or deformity   Heart:    Regular rate and rhythm, S1 and S2 normal, no murmur, rub   or gallop  Breast Exam:    Deferred to GYN  Abdomen:     Soft, non-tender, bowel sounds active all four quadrants,    no masses, no organomegaly  Genitalia:    Deferred to GYN  Rectal:    Extremities:   Extremities normal, atraumatic, no cyanosis or edema  Pulses:   2+ and symmetric all extremities  Skin:   Skin color, texture, turgor  normal, no rashes or lesions  Lymph nodes:   Cervical, supraclavicular, and axillary nodes normal  Neurologic:   CNII-XII intact, normal strength, sensation and reflexes    throughout          Assessment & Plan:

## 2014-11-02 LAB — LIPID PANEL
CHOL/HDL RATIO: 3
CHOLESTEROL: 171 mg/dL (ref 0–200)
HDL: 61 mg/dL (ref >39.00)
LDL Cholesterol: 98 mg/dL (ref 0–99)
NonHDL: 110
Triglycerides: 60 mg/dL (ref 0.0–149.0)
VLDL: 12 mg/dL (ref 0.0–40.0)

## 2014-11-02 LAB — BASIC METABOLIC PANEL
BUN: 10 mg/dL (ref 6–23)
CALCIUM: 9.1 mg/dL (ref 8.4–10.5)
CO2: 26 mEq/L (ref 19–32)
CREATININE: 0.77 mg/dL (ref 0.40–1.20)
Chloride: 102 mEq/L (ref 96–112)
GFR: 107.04 mL/min (ref >60.00)
GLUCOSE: 63 mg/dL — AB (ref 70–99)
POTASSIUM: 3.6 meq/L (ref 3.5–5.1)
Sodium: 136 mEq/L (ref 135–145)

## 2014-11-02 LAB — CBC WITH DIFFERENTIAL/PLATELET
BASOS ABS: 0 10*3/uL (ref 0.0–0.1)
BASOS PCT: 0.4 % (ref 0.0–3.0)
EOS PCT: 2.9 % (ref 0.0–5.0)
Eosinophils Absolute: 0.2 10*3/uL (ref 0.0–0.7)
HCT: 36.8 % (ref 36.0–46.0)
HEMOGLOBIN: 12.5 g/dL (ref 12.0–15.0)
LYMPHS PCT: 33.9 % (ref 12.0–46.0)
Lymphs Abs: 1.9 10*3/uL (ref 0.7–4.0)
MCHC: 33.9 g/dL (ref 30.0–36.0)
MCV: 94 fl (ref 78.0–100.0)
MONOS PCT: 5.3 % (ref 3.0–12.0)
Monocytes Absolute: 0.3 10*3/uL (ref 0.1–1.0)
NEUTROS PCT: 57.5 % (ref 43.0–77.0)
Neutro Abs: 3.2 10*3/uL (ref 1.4–7.7)
Platelets: 190 10*3/uL (ref 150.0–400.0)
RBC: 3.91 Mil/uL (ref 3.87–5.11)
RDW: 13 % (ref 11.5–15.5)
WBC: 5.7 10*3/uL (ref 4.0–10.5)

## 2014-11-02 LAB — HEPATIC FUNCTION PANEL
ALT: 12 U/L (ref 0–35)
AST: 14 U/L (ref 0–37)
Albumin: 4.3 g/dL (ref 3.5–5.2)
Alkaline Phosphatase: 59 U/L (ref 39–117)
BILIRUBIN DIRECT: 0.1 mg/dL (ref 0.0–0.3)
TOTAL PROTEIN: 7.6 g/dL (ref 6.0–8.3)
Total Bilirubin: 0.8 mg/dL (ref 0.2–1.2)

## 2014-11-02 LAB — VITAMIN D 25 HYDROXY (VIT D DEFICIENCY, FRACTURES): VITD: 33.97 ng/mL (ref 30.00–100.00)

## 2014-11-02 LAB — TSH: TSH: 0.63 u[IU]/mL (ref 0.35–4.50)

## 2014-11-02 LAB — MAGNESIUM: Magnesium: 1.8 mg/dL (ref 1.5–2.5)

## 2015-03-28 ENCOUNTER — Encounter: Payer: Self-pay | Admitting: Family Medicine

## 2015-03-28 ENCOUNTER — Ambulatory Visit (INDEPENDENT_AMBULATORY_CARE_PROVIDER_SITE_OTHER): Payer: PRIVATE HEALTH INSURANCE | Admitting: Family Medicine

## 2015-03-28 VITALS — BP 108/76 | HR 66 | Temp 98.1°F | Resp 16 | Ht 67.0 in | Wt 145.5 lb

## 2015-03-28 DIAGNOSIS — L819 Disorder of pigmentation, unspecified: Secondary | ICD-10-CM | POA: Diagnosis not present

## 2015-03-28 MED ORDER — CLOTRIMAZOLE-BETAMETHASONE 1-0.05 % EX CREA: 1.0000 "application " | TOPICAL_CREAM | Freq: Two times a day (BID) | CUTANEOUS | Status: DC

## 2015-03-28 NOTE — Patient Instructions (Signed)
Follow up as needed I suspect this is a Cafe au Lait spot (you can look this up and see what you think) and is most likely benign but we'll have derm take a look Apply the Lotrisone cream to treat possible fungal issue (although this is less likely) Call with any questions or concerns If you want to join [place] at the new [place] office, any scheduled appointments will automatically transfer and we will see you at [address] [place] [address] [place], [place] [address] (OPENING 05/15/15) Happy Holidays!!!

## 2015-03-28 NOTE — Progress Notes (Signed)
Pre visit review using our clinic review tool, if applicable. No additional management support is needed unless otherwise documented below in the visit note. 

## 2015-03-28 NOTE — Progress Notes (Signed)
   Subjective:    Patient ID: Lindsay Hanson, female    DOB: 09/12/1975, 39 y.o.   MRN: [id number]  HPI Spot- pt reports area started as 'bruise' under L armpit.  Area is now darker and enlarging.  First noticed 3-4 months ago.  Not painful, no itching.  No similar spots other places.  Pt has hx of facial hyperpigmentation.  No reported nodules   Review of Systems For ROS see HPI     Objective:   Physical Exam  Constitutional: She is oriented to person, place, and time. She appears well-developed and well-nourished. No distress.  HENT:  Head: Normocephalic and atraumatic.  Neurological: She is alert and oriented to person, place, and time.  Skin: Skin is warm and dry.  Well demarcated, flat, ovoid area of hyperpigmentation in L axilla, 2.5 x 1.5 cm No visible neurofibromas  Psychiatric: She has a normal mood and affect. Her behavior is normal. Thought content normal.  Vitals reviewed.         Assessment & Plan:

## 2015-03-29 ENCOUNTER — Encounter: Payer: Self-pay | Admitting: Family Medicine

## 2015-04-01 NOTE — Assessment & Plan Note (Signed)
New.  Pt has hx of facial hyperpigmentation.  Area under L arm appears consistent w/ cafe au lait spot.  No evidence of neurofibromas on PE.  Refer to derm for complete evaluation and tx.  Pt expressed understanding and is in agreement w/ plan.

## 2015-04-11 ENCOUNTER — Encounter: Payer: Self-pay | Admitting: General Practice

## 2015-07-05 ENCOUNTER — Other Ambulatory Visit (HOSPITAL_COMMUNITY): Payer: Self-pay | Admitting: Obstetrics

## 2015-07-05 DIAGNOSIS — Z3149 Encounter for other procreative investigation and testing: Secondary | ICD-10-CM

## 2015-07-11 ENCOUNTER — Ambulatory Visit (HOSPITAL_COMMUNITY)
Admission: RE | Admit: 2015-07-11 | Discharge: 2015-07-11 | Disposition: A | Discharge status: Home / Self Care | Payer: Managed Care, Other (non HMO) | Source: Ambulatory Visit | Attending: Obstetrics | Admitting: Obstetrics

## 2015-07-11 DIAGNOSIS — N979 Female infertility, unspecified: Secondary | ICD-10-CM | POA: Diagnosis not present

## 2015-07-11 DIAGNOSIS — Z3149 Encounter for other procreative investigation and testing: Secondary | ICD-10-CM

## 2015-07-11 MED ORDER — IOHEXOL 300 MG/ML  SOLN
30.0000 mL | Freq: Once | INTRAMUSCULAR | Status: AC | PRN
  Administered 2015-07-11: 30 mL

## 2015-07-12 ENCOUNTER — Ambulatory Visit (HOSPITAL_COMMUNITY): Payer: PRIVATE HEALTH INSURANCE

## 2015-11-07 ENCOUNTER — Encounter: Payer: Self-pay | Admitting: Family Medicine

## 2015-11-07 ENCOUNTER — Other Ambulatory Visit (INDEPENDENT_AMBULATORY_CARE_PROVIDER_SITE_OTHER): Payer: Managed Care, Other (non HMO)

## 2015-11-07 ENCOUNTER — Ambulatory Visit (INDEPENDENT_AMBULATORY_CARE_PROVIDER_SITE_OTHER): Payer: Managed Care, Other (non HMO) | Admitting: Family Medicine

## 2015-11-07 VITALS — BP 110/78 | HR 66 | Temp 98.0°F | Resp 16 | Ht 67.0 in | Wt 149.0 lb

## 2015-11-07 DIAGNOSIS — Z0000 Encounter for general adult medical examination without abnormal findings: Secondary | ICD-10-CM | POA: Diagnosis not present

## 2015-11-07 LAB — CBC WITH DIFFERENTIAL/PLATELET
Basophils Absolute: 0 10*3/uL (ref 0.0–0.1)
Basophils Relative: 0.5 % (ref 0.0–3.0)
Eosinophils Absolute: 0.1 10*3/uL (ref 0.0–0.7)
Eosinophils Relative: 1.2 % (ref 0.0–5.0)
HEMATOCRIT: 37.5 % (ref 36.0–46.0)
HEMOGLOBIN: 12.7 g/dL (ref 12.0–15.0)
LYMPHS ABS: 2.8 10*3/uL (ref 0.7–4.0)
Lymphocytes Relative: 35.2 % (ref 12.0–46.0)
MCHC: 33.8 g/dL (ref 30.0–36.0)
MCV: 91.8 fl (ref 78.0–100.0)
MONO ABS: 0.4 10*3/uL (ref 0.1–1.0)
Monocytes Relative: 4.6 % (ref 3.0–12.0)
Neutro Abs: 4.7 10*3/uL (ref 1.4–7.7)
Neutrophils Relative %: 58.5 % (ref 43.0–77.0)
Platelets: 210 10*3/uL (ref 150.0–400.0)
RBC: 4.09 Mil/uL (ref 3.87–5.11)
RDW: 12.2 % (ref 11.5–15.5)
WBC: 8 10*3/uL (ref 4.0–10.5)

## 2015-11-07 LAB — BASIC METABOLIC PANEL
BUN: 16 mg/dL (ref 6–23)
CHLORIDE: 103 meq/L (ref 96–112)
CO2: 27 mEq/L (ref 19–32)
Calcium: 9 mg/dL (ref 8.4–10.5)
Creatinine, Ser: 0.73 mg/dL (ref 0.40–1.20)
GFR: 113.25 mL/min (ref >60.00)
GLUCOSE: 80 mg/dL (ref 70–99)
POTASSIUM: 4.3 meq/L (ref 3.5–5.1)
Sodium: 136 mEq/L (ref 135–145)

## 2015-11-07 LAB — VITAMIN D 25 HYDROXY (VIT D DEFICIENCY, FRACTURES): VITD: 27.75 ng/mL — AB (ref 30.00–100.00)

## 2015-11-07 LAB — HEPATIC FUNCTION PANEL
ALBUMIN: 4.4 g/dL (ref 3.5–5.2)
ALK PHOS: 65 U/L (ref 39–117)
ALT: 13 U/L (ref 0–35)
AST: 13 U/L (ref 0–37)
Bilirubin, Direct: 0.2 mg/dL (ref 0.0–0.3)
TOTAL PROTEIN: 7.5 g/dL (ref 6.0–8.3)
Total Bilirubin: 0.8 mg/dL (ref 0.2–1.2)

## 2015-11-07 LAB — LIPID PANEL
CHOLESTEROL: 158 mg/dL (ref 0–200)
HDL: 56.6 mg/dL (ref >39.00)
LDL CALC: 91 mg/dL (ref 0–99)
NonHDL: 101.58
TRIGLYCERIDES: 55 mg/dL (ref 0.0–149.0)
Total CHOL/HDL Ratio: 3
VLDL: 11 mg/dL (ref 0.0–40.0)

## 2015-11-07 LAB — TSH: TSH: 0.8 u[IU]/mL (ref 0.35–4.50)

## 2015-11-07 NOTE — Patient Instructions (Signed)
Follow up in 1 year or as needed Go to the [place] office [address]- to get your labs done Keep up the good work!  You look great! If you continue to have chest pain (even if you think it's indigestion) please call me or your cardiologist so we can investigate further Call with any questions or concerns Thanks for sticking with [place]!!! Have a great summer!!!

## 2015-11-07 NOTE — Progress Notes (Signed)
   Subjective:    Patient ID: Lindsay Hanson, female    DOB: [birth year], 40 y.o.   MRN: [id number]  HPI CPE- UTD on pap/mammo (Dr Algie Coffer, Feb 2017).  Too young for colonoscopy.  UTD on Tdap.   Review of Systems Patient reports no vision/ hearing changes, adenopathy,fever, weight change,  persistant/recurrent hoarseness , swallowing issues, palpitations, edema, persistant/recurrent cough, hemoptysis, dyspnea (rest/exertional/paroxysmal nocturnal), gastrointestinal bleeding (melena, rectal bleeding), abdominal pain, significant heartburn, bowel changes, GU symptoms (dysuria, hematuria, incontinence), Gyn symptoms (abnormal  bleeding, pain),  syncope, focal weakness, memory loss, numbness & tingling, skin/hair/nail changes, abnormal bruising or bleeding, anxiety, or depression.   + CP- intermittent, pt has contributing this to indigestion.  Pt reports she'll have pain for 'like a day' and then 'be good for a few weeks'.  No associated SOB.  Cardiology aware    Objective:   Physical Exam General Appearance:    Alert, cooperative, no distress, appears stated age  Head:    Normocephalic, without obvious abnormality, atraumatic  Eyes:    PERRL, conjunctiva/corneas clear, EOM's intact, fundi    benign, both eyes  Ears:    Normal TM's and external ear canals, both ears  Nose:   Nares normal, septum midline, mucosa normal, no drainage    or sinus tenderness  Throat:   Lips, mucosa, and tongue normal; teeth and gums normal  Neck:   Supple, symmetrical, trachea midline, no adenopathy;    Thyroid: no enlargement/tenderness/nodules  Back:     Symmetric, no curvature, ROM normal, no CVA tenderness  Lungs:     Clear to auscultation bilaterally, respirations unlabored  Chest Wall:    No tenderness or deformity   Heart:    Regular rate and rhythm, S1 and S2 normal, no murmur, rub   or gallop  Breast Exam:    Deferred to GYN  Abdomen:     Soft, non-tender, bowel sounds active all four quadrants,    no  masses, no organomegaly  Genitalia:    Deferred to GYN  Rectal:    Extremities:   Extremities normal, atraumatic, no cyanosis or edema  Pulses:   2+ and symmetric all extremities  Skin:   Skin color, texture, turgor normal, no rashes or lesions  Lymph nodes:   Cervical, supraclavicular, and axillary nodes normal  Neurologic:   CNII-XII intact, normal strength, sensation and reflexes    throughout          Assessment & Plan:

## 2015-11-07 NOTE — Progress Notes (Signed)
Pre visit review using our clinic review tool, if applicable. No additional management support is needed unless otherwise documented below in the visit note. 

## 2015-11-08 ENCOUNTER — Other Ambulatory Visit: Payer: Self-pay | Admitting: General Practice

## 2015-11-08 MED ORDER — VITAMIN D (ERGOCALCIFEROL) 1.25 MG (50000 UNIT) PO CAPS: 50000.0000 [IU] | ORAL_CAPSULE | ORAL | Status: DC

## 2015-11-09 NOTE — Assessment & Plan Note (Signed)
Pt's PE WNL.  UTD on pap, mammo.  UTD on Tdap.  Check labs.  Anticipatory guidance provided.

## 2015-11-25 IMAGING — CR DG ABDOMEN 2V
2 series · 2 of 2 positions shown · non-contrast
Comparison: None.

CLINICAL DATA: 37-year-old female with acute mid abdominal pain and
distention. Initial encounter. Current history Gastro paresis.

EXAM:
ABDOMEN - 2 VIEW

[w abdomen upright]
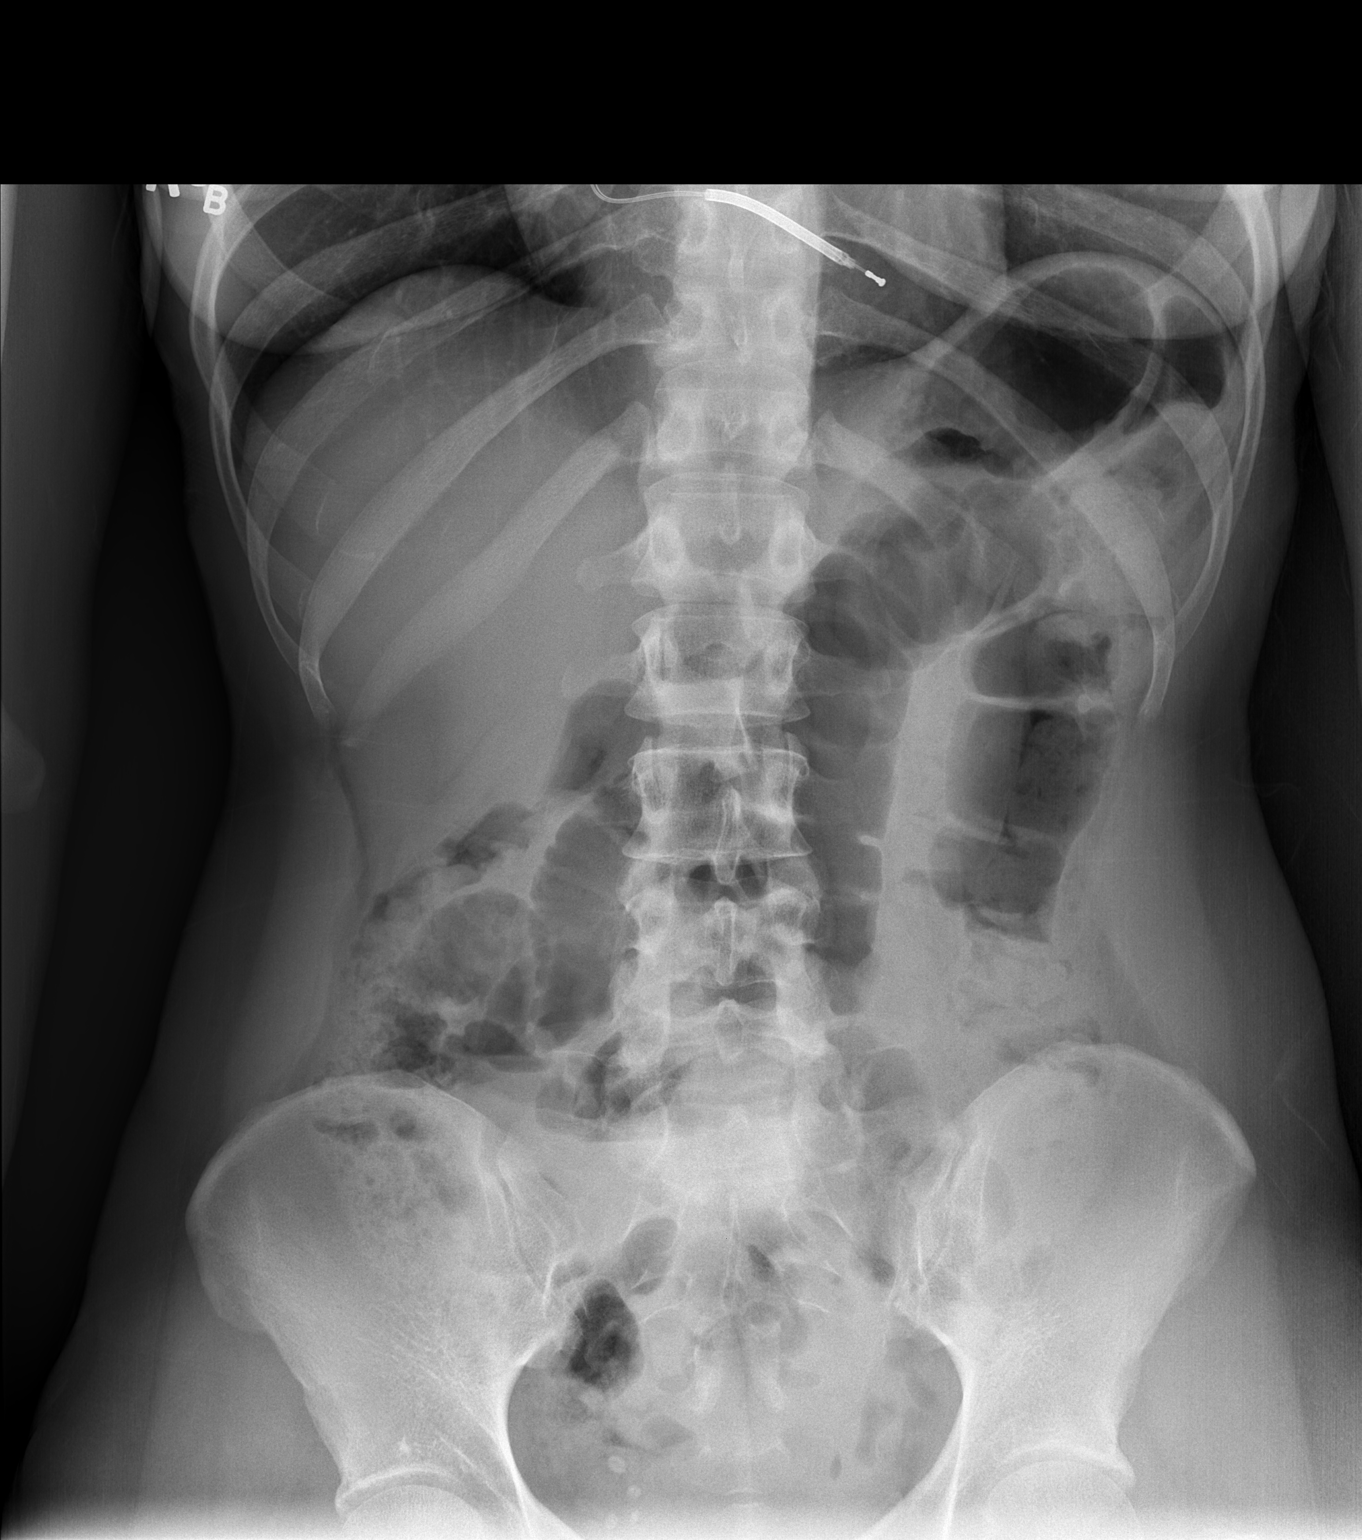

[t abdomen supine]
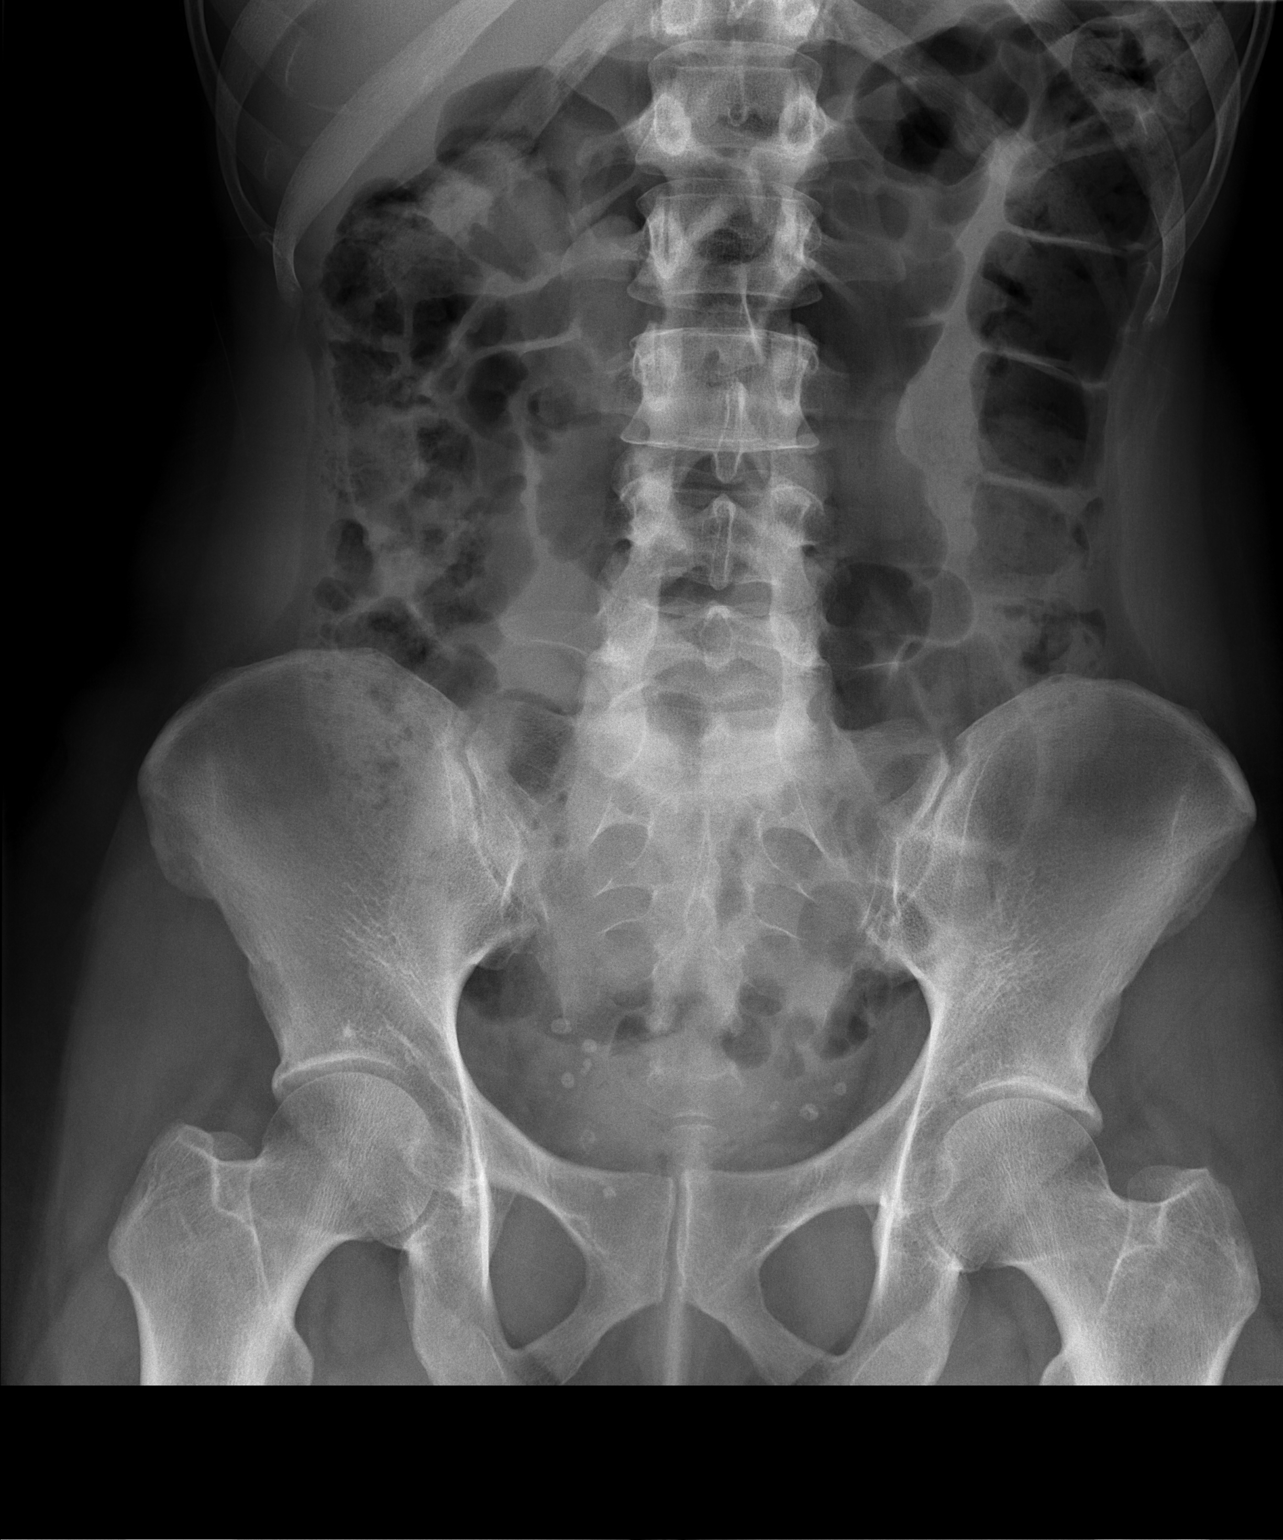

[2 of 2 positions shown; findings below may reference images not displayed]

FINDINGS: Partially visible cardiac AICD. Negative lung bases. Non obstructed
bowel gas pattern. Abdominal visceral contours are within normal
limits. Incidental pelvic phleboliths. No osseous abnormality
identified. No pneumoperitoneum.
IMPRESSION: Negative ; non obstructed bowel gas pattern, no free air.

## 2016-07-16 LAB — HM PAP SMEAR

## 2016-07-16 LAB — HM MAMMOGRAPHY: HM MAMMO: NORMAL (ref 0–4)

## 2016-11-07 ENCOUNTER — Encounter: Payer: Self-pay | Admitting: Family Medicine

## 2016-11-07 ENCOUNTER — Ambulatory Visit (INDEPENDENT_AMBULATORY_CARE_PROVIDER_SITE_OTHER): Payer: 59 | Admitting: Family Medicine

## 2016-11-07 VITALS — BP 110/64 | HR 62 | Temp 98.0°F | Resp 16 | Ht 67.0 in | Wt 151.2 lb

## 2016-11-07 DIAGNOSIS — Z0000 Encounter for general adult medical examination without abnormal findings: Secondary | ICD-10-CM

## 2016-11-07 LAB — BASIC METABOLIC PANEL
BUN: 12 mg/dL (ref 6–23)
CHLORIDE: 102 meq/L (ref 96–112)
CO2: 29 mEq/L (ref 19–32)
Calcium: 9.4 mg/dL (ref 8.4–10.5)
Creatinine, Ser: 0.75 mg/dL (ref 0.40–1.20)
GFR: 109.23 mL/min (ref >60.00)
GLUCOSE: 74 mg/dL (ref 70–99)
POTASSIUM: 4.1 meq/L (ref 3.5–5.1)
SODIUM: 138 meq/L (ref 135–145)

## 2016-11-07 LAB — LIPID PANEL
CHOLESTEROL: 176 mg/dL (ref 0–200)
HDL: 50.8 mg/dL (ref >39.00)
LDL CALC: 110 mg/dL — AB (ref 0–99)
NonHDL: 125.06
Total CHOL/HDL Ratio: 3
Triglycerides: 77 mg/dL (ref 0.0–149.0)
VLDL: 15.4 mg/dL (ref 0.0–40.0)

## 2016-11-07 LAB — HEPATIC FUNCTION PANEL
ALK PHOS: 68 U/L (ref 39–117)
ALT: 11 U/L (ref 0–35)
AST: 12 U/L (ref 0–37)
Albumin: 4.2 g/dL (ref 3.5–5.2)
BILIRUBIN DIRECT: 0.1 mg/dL (ref 0.0–0.3)
TOTAL PROTEIN: 7.3 g/dL (ref 6.0–8.3)
Total Bilirubin: 0.6 mg/dL (ref 0.2–1.2)

## 2016-11-07 LAB — CBC WITH DIFFERENTIAL/PLATELET
Basophils Absolute: 0.1 10*3/uL (ref 0.0–0.1)
Basophils Relative: 1 % (ref 0.0–3.0)
EOS PCT: 1.2 % (ref 0.0–5.0)
Eosinophils Absolute: 0.1 10*3/uL (ref 0.0–0.7)
HEMATOCRIT: 37.5 % (ref 36.0–46.0)
HEMOGLOBIN: 12.8 g/dL (ref 12.0–15.0)
LYMPHS ABS: 2.2 10*3/uL (ref 0.7–4.0)
Lymphocytes Relative: 32.5 % (ref 12.0–46.0)
MCHC: 34.2 g/dL (ref 30.0–36.0)
MCV: 92.9 fl (ref 78.0–100.0)
MONOS PCT: 5.6 % (ref 3.0–12.0)
Monocytes Absolute: 0.4 10*3/uL (ref 0.1–1.0)
NEUTROS ABS: 4.1 10*3/uL (ref 1.4–7.7)
Neutrophils Relative %: 59.7 % (ref 43.0–77.0)
Platelets: 199 10*3/uL (ref 150.0–400.0)
RBC: 4.04 Mil/uL (ref 3.87–5.11)
RDW: 12.8 % (ref 11.5–15.5)
WBC: 6.8 10*3/uL (ref 4.0–10.5)

## 2016-11-07 LAB — VITAMIN D 25 HYDROXY (VIT D DEFICIENCY, FRACTURES): VITD: 28.45 ng/mL — AB (ref 30.00–100.00)

## 2016-11-07 LAB — TSH: TSH: 0.51 u[IU]/mL (ref 0.35–4.50)

## 2016-11-07 NOTE — Progress Notes (Signed)
   Subjective:    Patient ID: Lindsay Hanson, female    DOB: 12-10-1975, 41 y.o.   MRN: [id number]  HPI CPE- UTD on GYN w/ Dr Algie Coffer.  UTD on Tdap (due next year).  No concerns today.   Review of Systems Patient reports no vision/ hearing changes, adenopathy,fever, weight change,  persistant/recurrent hoarseness , swallowing issues, chest pain, palpitations, edema, persistant/recurrent cough, hemoptysis, dyspnea (rest/exertional/paroxysmal nocturnal), gastrointestinal bleeding (melena, rectal bleeding), abdominal pain, significant heartburn, bowel changes, GU symptoms (dysuria, hematuria, incontinence), Gyn symptoms (abnormal  bleeding, pain),  syncope, focal weakness, memory loss, numbness & tingling, skin/hair/nail changes, abnormal bruising or bleeding, anxiety, or depression.     Objective:   Physical Exam General Appearance:    Alert, cooperative, no distress, appears stated age  Head:    Normocephalic, without obvious abnormality, atraumatic  Eyes:    PERRL, conjunctiva/corneas clear, EOM's intact, fundi    benign, both eyes  Ears:    Normal TM's and external ear canals, both ears  Nose:   Nares normal, septum midline, mucosa normal, no drainage    or sinus tenderness  Throat:   Lips, mucosa, and tongue normal; teeth and gums normal  Neck:   Supple, symmetrical, trachea midline, no adenopathy;    Thyroid: no enlargement/tenderness/nodules  Back:     Symmetric, no curvature, ROM normal, no CVA tenderness  Lungs:     Clear to auscultation bilaterally, respirations unlabored  Chest Wall:    No tenderness or deformity   Heart:    Regular rate and rhythm, S1 and S2 normal, no murmur, rub   or gallop  Breast Exam:    Deferred to GYN  Abdomen:     Soft, non-tender, bowel sounds active all four quadrants,    no masses, no organomegaly  Genitalia:    Deferred to GYN  Rectal:    Extremities:   Extremities normal, atraumatic, no cyanosis or edema  Pulses:   2+ and symmetric all  extremities  Skin:   Skin color, texture, turgor normal, no rashes or lesions  Lymph nodes:   Cervical, supraclavicular, and axillary nodes normal  Neurologic:   CNII-XII intact, normal strength, sensation and reflexes    throughout          Assessment & Plan:

## 2016-11-07 NOTE — Progress Notes (Signed)
Pre visit review using our clinic review tool, if applicable. No additional management support is needed unless otherwise documented below in the visit note. 

## 2016-11-07 NOTE — Assessment & Plan Note (Signed)
Pt's PE WNL.  UTD on GYN.  Seeing cardiology regularly.  Check labs.  Anticipatory guidance provided.

## 2016-11-07 NOTE — Patient Instructions (Signed)
Follow up in 1 year or as needed We'll notify you of your lab results and make any changes if needed Keep up the good work on healthy diet and regular exercise- you look great! Call with any questions or concerns Have a great summer!!! 

## 2016-11-10 ENCOUNTER — Encounter: Payer: Self-pay | Admitting: Family Medicine

## 2017-04-22 ENCOUNTER — Encounter: Payer: Self-pay | Admitting: Family Medicine

## 2017-05-27 ENCOUNTER — Telehealth: Payer: Self-pay | Admitting: Family Medicine

## 2017-05-27 NOTE — Telephone Encounter (Signed)
LM for pt to call back regarding scheduling an appt and to check with her insurance about physical.  Copied from CRM [id number]. Topic: Appointment Scheduling - Scheduling Inquiry for Clinic >> May 26, 2017  1:28 PM Viviann Spare wrote: Reason for CRM: Patient called to see if there is anyway possible that Dr. Beverely Low could see her before 11/21/17 @ 2:30 pm, she has a CPE on this day. The patient also needs a TB and a tetanus shot and because of her new insurance she need it before 06/15/2017. Patient is requesting a call back @ [phone]. I seached Dr. Beverely Low schedule and did not see anytime within the patient time frame.  >> May 27, 2017  9:01 AM Harmon Pier I wrote: Can you contact pt to advise she cannot have a sooner CPE based on her previous CPE date. In addition, she may need to make sure her [employer] plan allows for a CPE every year. Several [employer] plans only allow members 1 CPE for 24 months, not 12 months. If she needs a follow up or acute appt she can certainly schedule one prior to July. Thanks!

## 2017-06-09 DIAGNOSIS — H6123 Impacted cerumen, bilateral: Secondary | ICD-10-CM | POA: Diagnosis not present

## 2017-06-09 DIAGNOSIS — H6062 Unspecified chronic otitis externa, left ear: Secondary | ICD-10-CM | POA: Diagnosis not present

## 2017-06-10 DIAGNOSIS — Z131 Encounter for screening for diabetes mellitus: Secondary | ICD-10-CM | POA: Diagnosis not present

## 2017-06-10 DIAGNOSIS — Z1231 Encounter for screening mammogram for malignant neoplasm of breast: Secondary | ICD-10-CM | POA: Diagnosis not present

## 2017-06-10 DIAGNOSIS — N898 Other specified noninflammatory disorders of vagina: Secondary | ICD-10-CM | POA: Diagnosis not present

## 2017-06-10 DIAGNOSIS — Z1159 Encounter for screening for other viral diseases: Secondary | ICD-10-CM | POA: Diagnosis not present

## 2017-06-10 DIAGNOSIS — Z01419 Encounter for gynecological examination (general) (routine) without abnormal findings: Secondary | ICD-10-CM | POA: Diagnosis not present

## 2017-06-10 DIAGNOSIS — Z113 Encounter for screening for infections with a predominantly sexual mode of transmission: Secondary | ICD-10-CM | POA: Diagnosis not present

## 2017-06-10 DIAGNOSIS — Z13220 Encounter for screening for lipoid disorders: Secondary | ICD-10-CM | POA: Diagnosis not present

## 2017-06-10 DIAGNOSIS — Z6824 Body mass index (BMI) 24.0-24.9, adult: Secondary | ICD-10-CM | POA: Diagnosis not present

## 2017-06-10 DIAGNOSIS — Z130 Encounter for screening for diseases of the blood and blood-forming organs and certain disorders involving the immune mechanism: Secondary | ICD-10-CM | POA: Diagnosis not present

## 2017-06-10 DIAGNOSIS — Z118 Encounter for screening for other infectious and parasitic diseases: Secondary | ICD-10-CM | POA: Diagnosis not present

## 2017-06-10 DIAGNOSIS — Z1329 Encounter for screening for other suspected endocrine disorder: Secondary | ICD-10-CM | POA: Diagnosis not present

## 2017-06-10 DIAGNOSIS — Z0000 Encounter for general adult medical examination without abnormal findings: Secondary | ICD-10-CM | POA: Diagnosis not present

## 2017-06-10 LAB — HM MAMMOGRAPHY

## 2017-06-23 ENCOUNTER — Encounter: Payer: Self-pay | Admitting: General Practice

## 2017-11-11 ENCOUNTER — Encounter: Payer: Self-pay | Admitting: Family Medicine

## 2017-11-11 ENCOUNTER — Other Ambulatory Visit: Payer: Self-pay

## 2017-11-11 ENCOUNTER — Ambulatory Visit (INDEPENDENT_AMBULATORY_CARE_PROVIDER_SITE_OTHER): Payer: BLUE CROSS/BLUE SHIELD | Admitting: Family Medicine

## 2017-11-11 VITALS — BP 132/72 | HR 82 | Temp 98.2°F | Resp 16 | Ht 66.0 in | Wt 149.8 lb

## 2017-11-11 DIAGNOSIS — E559 Vitamin D deficiency, unspecified: Secondary | ICD-10-CM | POA: Insufficient documentation

## 2017-11-11 DIAGNOSIS — Z0000 Encounter for general adult medical examination without abnormal findings: Secondary | ICD-10-CM

## 2017-11-11 DIAGNOSIS — I428 Other cardiomyopathies: Secondary | ICD-10-CM

## 2017-11-11 DIAGNOSIS — Q249 Congenital malformation of heart, unspecified: Secondary | ICD-10-CM | POA: Diagnosis not present

## 2017-11-11 NOTE — Assessment & Plan Note (Signed)
Pt's PE WNL.  UTD on mammo, pap, immunizations.  Check labs.  Anticipatory guidance provided.

## 2017-11-11 NOTE — Assessment & Plan Note (Signed)
Pt has stopped Sotalol w/o informing cards.  EKG shows mild T wave changes in V5/V6.  Will send cards copy of today's note and EKG.

## 2017-11-11 NOTE — Progress Notes (Signed)
   Subjective:    Patient ID: Lindsay Hanson, female    DOB: Sep 21, 1975, 42 y.o.   MRN: [id number]  HPI CPE- UTD on pap, mammo.  Pt stopped Sotalol 'a few months ago' but Cards is not aware.     Review of Systems Patient reports no vision/ hearing changes, adenopathy,fever, weight change,  persistant/recurrent hoarseness , swallowing issues, chest pain, palpitations, edema, persistant/recurrent cough, hemoptysis, dyspnea (rest/exertional/paroxysmal nocturnal), gastrointestinal bleeding (melena, rectal bleeding), abdominal pain, significant heartburn, bowel changes, GU symptoms (dysuria, hematuria, incontinence), Gyn symptoms (abnormal  bleeding, pain),  syncope, focal weakness, memory loss, numbness & tingling, skin/hair/nail changes, abnormal bruising or bleeding, anxiety, or depression.     Objective:   Physical Exam General Appearance:    Alert, cooperative, no distress, appears stated age  Head:    Normocephalic, without obvious abnormality, atraumatic  Eyes:    PERRL, conjunctiva/corneas clear, EOM's intact, fundi    benign, both eyes  Ears:    Normal TM's and external ear canals, both ears  Nose:   Nares normal, septum midline, mucosa normal, no drainage    or sinus tenderness  Throat:   Lips, mucosa, and tongue normal; teeth and gums normal  Neck:   Supple, symmetrical, trachea midline, no adenopathy;    Thyroid: no enlargement/tenderness/nodules  Back:     Symmetric, no curvature, ROM normal, no CVA tenderness  Lungs:     Clear to auscultation bilaterally, respirations unlabored  Chest Wall:    No tenderness or deformity   Heart:    Regular rate and rhythm, S1 and S2 normal, no murmur, rub   or gallop  Breast Exam:    Deferred to mammo  Abdomen:     Soft, non-tender, bowel sounds active all four quadrants,    no masses, no organomegaly  Genitalia:    Deferred to GYN  Rectal:    Extremities:   Extremities normal, atraumatic, no cyanosis or edema  Pulses:   2+ and symmetric all  extremities  Skin:   Skin color, texture, turgor normal, no rashes or lesions  Lymph nodes:   Cervical, supraclavicular, and axillary nodes normal  Neurologic:   CNII-XII intact, normal strength, sensation and reflexes    throughout          Assessment & Plan:

## 2017-11-11 NOTE — Assessment & Plan Note (Signed)
Check labs and replete prn. 

## 2017-11-11 NOTE — Patient Instructions (Signed)
Follow up in 1 year or as needed We'll notify you of your lab results and make any changes if needed Keep up the good work on healthy diet and regular exercise- you look great! Call with any questions or concerns Happy 4th!! 

## 2017-11-12 LAB — TSH: TSH: 0.63 m[IU]/L

## 2017-11-12 LAB — CBC WITH DIFFERENTIAL/PLATELET
BASOS PCT: 0.8 %
Basophils Absolute: 48 cells/uL (ref 0–200)
EOS PCT: 0.7 %
Eosinophils Absolute: 42 cells/uL (ref 15–500)
HCT: 38.1 % (ref 35.0–45.0)
Hemoglobin: 13.1 g/dL (ref 11.7–15.5)
Lymphs Abs: 1950 cells/uL (ref 850–3900)
MCH: 31.8 pg (ref 27.0–33.0)
MCHC: 34.4 g/dL (ref 32.0–36.0)
MCV: 92.5 fL (ref 80.0–100.0)
MONOS PCT: 5.3 %
MPV: 11.3 fL (ref 7.5–12.5)
Neutro Abs: 3642 cells/uL (ref 1500–7800)
Neutrophils Relative %: 60.7 %
PLATELETS: 201 10*3/uL (ref 140–400)
RBC: 4.12 10*6/uL (ref 3.80–5.10)
RDW: 11.5 % (ref 11.0–15.0)
TOTAL LYMPHOCYTE: 32.5 %
WBC mixed population: 318 cells/uL (ref 200–950)
WBC: 6 10*3/uL (ref 3.8–10.8)

## 2017-11-12 LAB — BASIC METABOLIC PANEL
BUN: 14 mg/dL (ref 7–25)
CALCIUM: 9.4 mg/dL (ref 8.6–10.2)
CHLORIDE: 101 mmol/L (ref 98–110)
CO2: 21 mmol/L (ref 20–32)
Creat: 0.77 mg/dL (ref 0.50–1.10)
GLUCOSE: 68 mg/dL (ref 65–99)
Potassium: 3.6 mmol/L (ref 3.5–5.3)
Sodium: 136 mmol/L (ref 135–146)

## 2017-11-12 LAB — LIPID PANEL
CHOL/HDL RATIO: 2.7 (calc) (ref <5.0)
Cholesterol: 190 mg/dL (ref <200)
HDL: 70 mg/dL (ref >50)
LDL CHOLESTEROL (CALC): 105 mg/dL — AB
Non-HDL Cholesterol (Calc): 120 mg/dL (calc) (ref <130)
TRIGLYCERIDES: 60 mg/dL (ref <150)

## 2017-11-12 LAB — HEPATIC FUNCTION PANEL
AG RATIO: 1.5 (calc) (ref 1.0–2.5)
ALKALINE PHOSPHATASE (APISO): 75 U/L (ref 33–115)
ALT: 7 U/L (ref 6–29)
AST: 13 U/L (ref 10–30)
Albumin: 4.5 g/dL (ref 3.6–5.1)
BILIRUBIN INDIRECT: 0.7 mg/dL (ref 0.2–1.2)
Bilirubin, Direct: 0.2 mg/dL (ref 0.0–0.2)
Globulin: 3.1 g/dL (calc) (ref 1.9–3.7)
TOTAL PROTEIN: 7.6 g/dL (ref 6.1–8.1)
Total Bilirubin: 0.9 mg/dL (ref 0.2–1.2)

## 2017-11-12 LAB — MAGNESIUM: Magnesium: 1.8 mg/dL (ref 1.5–2.5)

## 2017-11-12 LAB — VITAMIN D 25 HYDROXY (VIT D DEFICIENCY, FRACTURES): VIT D 25 HYDROXY: 24 ng/mL — AB (ref 30–100)

## 2017-11-13 ENCOUNTER — Other Ambulatory Visit: Payer: Self-pay

## 2017-11-13 MED ORDER — VITAMIN D (ERGOCALCIFEROL) 1.25 MG (50000 UNIT) PO CAPS: 50000.0000 [IU] | ORAL_CAPSULE | ORAL | 0 refills | Status: AC

## 2018-04-13 DIAGNOSIS — Z6825 Body mass index (BMI) 25.0-25.9, adult: Secondary | ICD-10-CM | POA: Diagnosis not present

## 2018-04-13 DIAGNOSIS — I428 Other cardiomyopathies: Secondary | ICD-10-CM | POA: Diagnosis not present

## 2018-04-13 DIAGNOSIS — Z23 Encounter for immunization: Secondary | ICD-10-CM | POA: Diagnosis not present

## 2018-04-13 DIAGNOSIS — E785 Hyperlipidemia, unspecified: Secondary | ICD-10-CM | POA: Diagnosis not present

## 2018-04-21 DIAGNOSIS — Z95810 Presence of automatic (implantable) cardiac defibrillator: Secondary | ICD-10-CM | POA: Diagnosis not present

## 2018-04-21 DIAGNOSIS — Z4502 Encounter for adjustment and management of automatic implantable cardiac defibrillator: Secondary | ICD-10-CM | POA: Diagnosis not present

## 2018-04-21 DIAGNOSIS — I472 Ventricular tachycardia: Secondary | ICD-10-CM | POA: Diagnosis not present

## 2018-04-21 DIAGNOSIS — I428 Other cardiomyopathies: Secondary | ICD-10-CM | POA: Diagnosis not present

## 2018-04-21 DIAGNOSIS — R9431 Abnormal electrocardiogram [ECG] [EKG]: Secondary | ICD-10-CM | POA: Diagnosis not present

## 2018-04-21 DIAGNOSIS — I498 Other specified cardiac arrhythmias: Secondary | ICD-10-CM | POA: Diagnosis not present

## 2018-04-21 DIAGNOSIS — I471 Supraventricular tachycardia: Secondary | ICD-10-CM | POA: Diagnosis not present

## 2018-04-21 DIAGNOSIS — Z79899 Other long term (current) drug therapy: Secondary | ICD-10-CM | POA: Diagnosis not present

## 2018-11-15 ENCOUNTER — Encounter: Payer: BLUE CROSS/BLUE SHIELD | Admitting: Family Medicine

## 2018-11-15 DIAGNOSIS — Z0000 Encounter for general adult medical examination without abnormal findings: Secondary | ICD-10-CM | POA: Diagnosis not present

## 2018-11-15 DIAGNOSIS — I428 Other cardiomyopathies: Secondary | ICD-10-CM | POA: Diagnosis not present

## 2018-11-15 DIAGNOSIS — E785 Hyperlipidemia, unspecified: Secondary | ICD-10-CM | POA: Diagnosis not present

## 2018-11-30 DIAGNOSIS — Z0000 Encounter for general adult medical examination without abnormal findings: Secondary | ICD-10-CM | POA: Diagnosis not present

## 2018-11-30 DIAGNOSIS — Z6825 Body mass index (BMI) 25.0-25.9, adult: Secondary | ICD-10-CM | POA: Diagnosis not present

## 2018-12-27 DIAGNOSIS — Z1231 Encounter for screening mammogram for malignant neoplasm of breast: Secondary | ICD-10-CM | POA: Diagnosis not present

## 2019-01-07 DIAGNOSIS — T783XXA Angioneurotic edema, initial encounter: Secondary | ICD-10-CM | POA: Diagnosis not present

## 2019-01-10 DIAGNOSIS — B9689 Other specified bacterial agents as the cause of diseases classified elsewhere: Secondary | ICD-10-CM | POA: Diagnosis not present

## 2019-01-10 DIAGNOSIS — N760 Acute vaginitis: Secondary | ICD-10-CM | POA: Diagnosis not present

## 2019-01-10 DIAGNOSIS — Z01419 Encounter for gynecological examination (general) (routine) without abnormal findings: Secondary | ICD-10-CM | POA: Diagnosis not present

## 2019-01-10 DIAGNOSIS — Z113 Encounter for screening for infections with a predominantly sexual mode of transmission: Secondary | ICD-10-CM | POA: Diagnosis not present

## 2019-01-10 DIAGNOSIS — N898 Other specified noninflammatory disorders of vagina: Secondary | ICD-10-CM | POA: Diagnosis not present

## 2019-01-10 DIAGNOSIS — Z3202 Encounter for pregnancy test, result negative: Secondary | ICD-10-CM | POA: Diagnosis not present

## 2019-01-14 DIAGNOSIS — R922 Inconclusive mammogram: Secondary | ICD-10-CM | POA: Diagnosis not present

## 2019-01-14 DIAGNOSIS — R921 Mammographic calcification found on diagnostic imaging of breast: Secondary | ICD-10-CM | POA: Diagnosis not present

## 2019-04-12 DIAGNOSIS — Z23 Encounter for immunization: Secondary | ICD-10-CM | POA: Diagnosis not present

## 2019-04-29 DIAGNOSIS — Z95810 Presence of automatic (implantable) cardiac defibrillator: Secondary | ICD-10-CM | POA: Diagnosis not present

## 2019-04-29 DIAGNOSIS — I472 Ventricular tachycardia: Secondary | ICD-10-CM | POA: Diagnosis not present

## 2019-04-29 DIAGNOSIS — I428 Other cardiomyopathies: Secondary | ICD-10-CM | POA: Diagnosis not present

## 2019-04-29 DIAGNOSIS — Z4502 Encounter for adjustment and management of automatic implantable cardiac defibrillator: Secondary | ICD-10-CM | POA: Diagnosis not present
